# Patient Record
Sex: Female | Born: 1990 | Race: Asian | Hispanic: No | Marital: Single | Smoking: Never smoker
Health system: Southern US, Community
[De-identification: ages and names within clinical notes are randomized; demographics above are authoritative.]

---

## 2013-11-16 ENCOUNTER — Ambulatory Visit (INDEPENDENT_AMBULATORY_CARE_PROVIDER_SITE_OTHER): Payer: 59

## 2013-11-16 ENCOUNTER — Ambulatory Visit (INDEPENDENT_AMBULATORY_CARE_PROVIDER_SITE_OTHER): Payer: 59 | Admitting: Family Medicine

## 2013-11-16 VITALS — BP 106/70 | HR 80 | Temp 98.7°F | Resp 18 | Ht 62.0 in | Wt 96.2 lb

## 2013-11-16 DIAGNOSIS — Z020 Encounter for examination for admission to educational institution: Secondary | ICD-10-CM

## 2013-11-16 DIAGNOSIS — Z130 Encounter for screening for diseases of the blood and blood-forming organs and certain disorders involving the immune mechanism: Secondary | ICD-10-CM

## 2013-11-16 DIAGNOSIS — Z111 Encounter for screening for respiratory tuberculosis: Secondary | ICD-10-CM

## 2013-11-16 DIAGNOSIS — K5900 Constipation, unspecified: Secondary | ICD-10-CM

## 2013-11-16 DIAGNOSIS — Z23 Encounter for immunization: Secondary | ICD-10-CM

## 2013-11-16 DIAGNOSIS — Z0289 Encounter for other administrative examinations: Secondary | ICD-10-CM

## 2013-11-16 LAB — POCT CBC
Granulocyte percent: 45.1 %G (ref 37–80)
HCT, POC: 44.7 % (ref 37.7–47.9)
HEMOGLOBIN: 15 g/dL (ref 12.2–16.2)
Lymph, poc: 1.9 (ref 0.6–3.4)
MCH, POC: 30.4 pg (ref 27–31.2)
MCHC: 33.6 g/dL (ref 31.8–35.4)
MCV: 90.5 fL (ref 80–97)
MID (cbc): 0.4 (ref 0–0.9)
MPV: 7.1 fL (ref 0–99.8)
POC Granulocyte: 1.9 — AB (ref 2–6.9)
POC LYMPH PERCENT: 45.1 %L (ref 10–50)
POC MID %: 9.8 %M (ref 0–12)
Platelet Count, POC: 283 10*3/uL (ref 142–424)
RBC: 1.93 M/uL — AB (ref 4.04–5.48)
RDW, POC: 13.4 %
WBC: 4.3 10*3/uL — AB (ref 4.6–10.2)

## 2013-11-16 LAB — POCT URINALYSIS DIPSTICK
BILIRUBIN UA: NEGATIVE
GLUCOSE UA: NEGATIVE
NITRITE UA: NEGATIVE
Protein, UA: NEGATIVE
SPEC GRAV UA: 1.01
Urobilinogen, UA: 0.2
pH, UA: 6

## 2013-11-16 NOTE — Patient Instructions (Signed)
Use oil or lotion on hands/elbows twice a day- especially after bathing and at bedtime.   Constipation Constipation is when a person has fewer than three bowel movements a week, has difficulty having a bowel movement, or has stools that are dry, hard, or larger than normal. As people grow older, constipation is more common. If you try to fix constipation with medicines that make you have a bowel movement (laxatives), the problem may get worse. Long-term laxative use may cause the muscles of the colon to become weak. A low-fiber diet, not taking in enough fluids, and taking certain medicines may make constipation worse.  CAUSES   Certain medicines, such as antidepressants, pain medicine, iron supplements, antacids, and water pills.   Certain diseases, such as diabetes, irritable bowel syndrome (IBS), thyroid disease, or depression.   Not drinking enough water.   Not eating enough fiber-rich foods.   Stress or travel.   Lack of physical activity or exercise.   Ignoring the urge to have a bowel movement.   Using laxatives too much.  SIGNS AND SYMPTOMS   Having fewer than three bowel movements a week.   Straining to have a bowel movement.   Having stools that are hard, dry, or larger than normal.   Feeling full or bloated.   Pain in the lower abdomen.   Not feeling relief after having a bowel movement.  DIAGNOSIS  Your health care provider will take a medical history and perform a physical exam. Further testing may be done for severe constipation. Some tests may include:  A barium enema X-ray to examine your rectum, colon, and, sometimes, your small intestine.   A sigmoidoscopy to examine your lower colon.   A colonoscopy to examine your entire colon. TREATMENT  Treatment will depend on the severity of your constipation and what is causing it. Some dietary treatments include drinking more fluids and eating more fiber-rich foods. Lifestyle treatments may include  regular exercise. If these diet and lifestyle recommendations do not help, your health care provider may recommend taking over-the-counter laxative medicines to help you have bowel movements. Prescription medicines may be prescribed if over-the-counter medicines do not work.  HOME CARE INSTRUCTIONS   Eat foods that have a lot of fiber, such as fruits, vegetables, whole grains, and beans.  Limit foods high in fat and processed sugars, such as french fries, hamburgers, cookies, candies, and soda.   A fiber supplement may be added to your diet if you cannot get enough fiber from foods.   Drink enough fluids to keep your urine clear or pale yellow.   Exercise regularly or as directed by your health care provider.   Go to the restroom when you have the urge to go. Do not hold it.   Only take over-the-counter or prescription medicines as directed by your health care provider. Do not take other medicines for constipation without talking to your health care provider first.  SEEK IMMEDIATE MEDICAL CARE IF:   You have bright red blood in your stool.   Your constipation lasts for more than 4 days or gets worse.   You have abdominal or rectal pain.   You have thin, pencil-like stools.   You have unexplained weight loss. MAKE SURE YOU:   Understand these instructions.  Will watch your condition.  Will get help right away if you are not doing well or get worse. Document Released: 01/09/2004 Document Revised: 04/17/2013 Document Reviewed: 01/22/2013 [facility] Patient Information 2015 [place]. This information is not intended  to replace advice given to you by your health care provider. Make sure you discuss any questions you have with your health care provider.  

## 2013-11-16 NOTE — Progress Notes (Signed)
Subjective:    Patient ID: Delmar LandauDan Goethe, female    DOB: 01/13/1991, 23 y.o.   MRN: [id number]  HPI Patient presents today to have a form completed to attend [employer]. 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 She was born in [place] and has lived in the [place] for about 4 years. She has been living in [place] but is moving to [place]. She had a physical with PAP last month while in [place], she has no records from this visit. She brings her immunization record today. She has had a positive TB skin test in the past. She had a negative CXRAY 2 years ago.   She reports long standing constipation, requiring 1x week laxative. She has added milk (frosted flakes) and cereal to her diet to help. Doesn't think she drinks enough water or exercises enough.  The skin on her hands and elbows has been dry.   History reviewed. No pertinent past medical history. History reviewed. No pertinent past surgical history. FH- parents alive and well SH- no tobacco, no ETOH, no illicit drugs   Review of Systems  Constitutional: Negative.   HENT: Negative.   Eyes: Negative.   Respiratory: Negative.   Cardiovascular: Negative.   Gastrointestinal: Positive for constipation.  Endocrine: Negative.   Genitourinary: Negative.   Musculoskeletal: Negative.   Skin:       Dry skin on hands and elbows.   Allergic/Immunologic: Negative.   Neurological: Negative.   Hematological: Negative.   Psychiatric/Behavioral: Negative.        Objective:   Physical Exam  Vitals reviewed. Constitutional: She is oriented to person, place, and time. She appears well-developed and well-nourished.  HENT:  Head: Normocephalic and atraumatic.  Right Ear: Tympanic membrane, external ear and ear canal normal.  Left Ear: Tympanic membrane, external ear and ear canal normal.  Nose: Nose normal.  Mouth/Throat: Oropharynx is clear and moist.  Eyes: Conjunctivae and EOM are normal. Pupils are equal, round, and  reactive to light. Right eye exhibits no discharge. Left eye exhibits no discharge. No scleral icterus.  Neck: Normal range of motion. Neck supple. No thyromegaly present.  Cardiovascular: Normal rate, regular rhythm, normal heart sounds and intact distal pulses.   Pulmonary/Chest: Effort normal and breath sounds normal.  Abdominal: Soft. Bowel sounds are normal. She exhibits no distension and no mass. There is no tenderness. There is no rebound and no guarding.  Musculoskeletal: Normal range of motion.  Neurological: She is alert and oriented to person, place, and time. She has normal reflexes.  Skin: Skin is warm and dry.  Psychiatric: She has a normal mood and affect. Her behavior is normal. Judgment and thought content normal.   CXRay to rule out pulmonary TB [facility] reading (PRIMARY) by  Dr. Patsy Lageropland- negative for pulmonary tuberculosis.  Results for orders placed in visit on 11/16/13  POCT CBC      Result Value Ref Range   WBC 4.3 (*) 4.6 - 10.2 K/uL   Lymph, poc 1.9  0.6 - 3.4   POC LYMPH PERCENT 45.1  10 - 50 %L   MID (cbc) 0.4  0 - 0.9   POC MID % 9.8  0 - 12 %M   POC Granulocyte 1.9 (*) 2 - 6.9   Granulocyte percent 45.1  37 - 80 %G   RBC 1.93 (*) 4.04 - 5.48 M/uL   Hemoglobin 15.0  12.2 - 16.2 g/dL   HCT, POC 82.944.7  56.237.7 - 47.9 %  MCV 90.5  80 - 97 fL   MCH, POC 30.4  27 - 31.2 pg   MCHC 33.6  31.8 - 35.4 g/dL   RDW, POC 16.1     Platelet Count, POC 283  142 - 424 K/uL   MPV 7.1  0 - 99.8 fL  POCT URINALYSIS DIPSTICK      Result Value Ref Range   Color, UA yellow     Clarity, UA clear     Glucose, UA neg     Bilirubin, UA neg     Ketones, UA trace     Spec Grav, UA 1.010     Blood, UA tr-lysed     pH, UA 6.0     Protein, UA neg     Urobilinogen, UA 0.2     Nitrite, UA neg     Leukocytes, UA small (1+)         Assessment & Plan:  1. Screening for tuberculosis - DG Chest 1 View; Future -negative for pulmonary TB  2. Screening for deficiency anemia - POCT  CBC  3. Encounter for school history and physical examination - POCT CBC - POCT urinalysis dipstick  4. Unspecified constipation -provided written and verbal information regarding increasing fiber, fluids and exercise  5. Need for Tdap vaccination -tdap given today   Emi Belfast, FNP-BC  [facility], [facility]  11/16/2013 9:47 AM

## 2014-01-13 ENCOUNTER — Encounter (HOSPITAL_COMMUNITY): Payer: Self-pay | Admitting: Emergency Medicine

## 2014-01-13 ENCOUNTER — Emergency Department (HOSPITAL_COMMUNITY)
Admission: EM | Admit: 2014-01-13 | Discharge: 2014-01-13 | Disposition: A | Discharge status: Home / Self Care | Payer: 59 | Attending: Emergency Medicine | Admitting: Emergency Medicine

## 2014-01-13 DIAGNOSIS — R1012 Left upper quadrant pain: Secondary | ICD-10-CM | POA: Diagnosis not present

## 2014-01-13 DIAGNOSIS — Z3202 Encounter for pregnancy test, result negative: Secondary | ICD-10-CM | POA: Insufficient documentation

## 2014-01-13 LAB — COMPREHENSIVE METABOLIC PANEL
ALK PHOS: 63 U/L (ref 39–117)
ALT: 19 U/L (ref 0–35)
AST: 24 U/L (ref 0–37)
Albumin: 4.3 g/dL (ref 3.5–5.2)
Anion gap: 13 (ref 5–15)
BUN: 14 mg/dL (ref 6–23)
CALCIUM: 9.4 mg/dL (ref 8.4–10.5)
CO2: 23 meq/L (ref 19–32)
Chloride: 102 mEq/L (ref 96–112)
Creatinine, Ser: 0.68 mg/dL (ref 0.50–1.10)
Glucose, Bld: 118 mg/dL — ABNORMAL HIGH (ref 70–99)
POTASSIUM: 3.5 meq/L — AB (ref 3.7–5.3)
SODIUM: 138 meq/L (ref 137–147)
Total Bilirubin: <0.2 mg/dL — ABNORMAL LOW (ref 0.3–1.2)
Total Protein: 7.5 g/dL (ref 6.0–8.3)

## 2014-01-13 LAB — CBC WITH DIFFERENTIAL/PLATELET
Basophils Absolute: 0 10*3/uL (ref 0.0–0.1)
Basophils Relative: 0 % (ref 0–1)
EOS ABS: 0.1 10*3/uL (ref 0.0–0.7)
Eosinophils Relative: 1 % (ref 0–5)
HCT: 42.2 % (ref 36.0–46.0)
HEMOGLOBIN: 14.4 g/dL (ref 12.0–15.0)
LYMPHS ABS: 1.5 10*3/uL (ref 0.7–4.0)
LYMPHS PCT: 17 % (ref 12–46)
MCH: 30.4 pg (ref 26.0–34.0)
MCHC: 34.1 g/dL (ref 30.0–36.0)
MCV: 89.2 fL (ref 78.0–100.0)
MONOS PCT: 6 % (ref 3–12)
Monocytes Absolute: 0.5 10*3/uL (ref 0.1–1.0)
NEUTROS PCT: 76 % (ref 43–77)
Neutro Abs: 6.9 10*3/uL (ref 1.7–7.7)
Platelets: 239 10*3/uL (ref 150–400)
RBC: 4.73 MIL/uL (ref 3.87–5.11)
RDW: 12.3 % (ref 11.5–15.5)
WBC: 9.1 10*3/uL (ref 4.0–10.5)

## 2014-01-13 LAB — LIPASE, BLOOD: Lipase: 73 U/L — ABNORMAL HIGH (ref 11–59)

## 2014-01-13 LAB — URINALYSIS, ROUTINE W REFLEX MICROSCOPIC
Bilirubin Urine: NEGATIVE
GLUCOSE, UA: NEGATIVE mg/dL
HGB URINE DIPSTICK: NEGATIVE
Ketones, ur: NEGATIVE mg/dL
LEUKOCYTES UA: NEGATIVE
Nitrite: NEGATIVE
PH: 7 (ref 5.0–8.0)
PROTEIN: NEGATIVE mg/dL
Specific Gravity, Urine: 1.01 (ref 1.005–1.030)
Urobilinogen, UA: 0.2 mg/dL (ref 0.0–1.0)

## 2014-01-13 LAB — PREGNANCY, URINE: Preg Test, Ur: NEGATIVE

## 2014-01-13 NOTE — Discharge Instructions (Signed)
Please read and follow all provided instructions.  Your diagnoses today include:  1. Left upper quadrant pain     Tests performed today include:  Blood counts and electrolytes  Blood tests to check liver and kidney function  Blood tests to check pancreas function  Urine test to look for infection and pregnancy (in women)  Vital signs. See below for your results today.   Medications prescribed:   None  Take any prescribed medications only as directed.  Home care instructions:   Follow any educational materials contained in this packet.  Follow-up instructions: Please follow-up with your primary care provider in the next 2 days for further evaluation of your symptoms.    Return instructions:  SEEK IMMEDIATE MEDICAL ATTENTION IF:  The pain does not go away or becomes severe   A temperature above 101F develops   Repeated vomiting occurs (multiple episodes)   The pain becomes localized to portions of the abdomen. The right side could possibly be appendicitis. In an adult, the left lower portion of the abdomen could be colitis or diverticulitis.   Blood is being passed in stools or vomit (bright red or black tarry stools)   You develop chest pain, difficulty breathing, dizziness or fainting, or become confused, poorly responsive, or inconsolable (young children)  If you have any other emergent concerns regarding your health  Additional Information: Abdominal (belly) pain can be caused by many things. Your caregiver performed an examination and possibly ordered blood/urine tests and imaging (CT scan, x-rays, ultrasound). Many cases can be observed and treated at home after initial evaluation in the emergency department. Even though you are being discharged home, abdominal pain can be unpredictable. Therefore, you need a repeated exam if your pain does not resolve, returns, or worsens. Most patients with abdominal pain don't have to be admitted to the hospital or have  surgery, but serious problems like appendicitis and gallbladder attacks can start out as nonspecific pain. Many abdominal conditions cannot be diagnosed in one visit, so follow-up evaluations are very important.  Your vital signs today were: BP 111/66   Pulse 75   Temp(Src) 99 F (37.2 C) (Oral)   Resp 16   Ht  (1.6 m)   Wt 110 lb (49.896 kg)   BMI 19.49 kg/m2   SpO2 100%   LMP 01/03/2014 If your blood pressure (bp) was elevated above 135/85 this visit, please have this repeated by your doctor within one month. --------------

## 2014-01-13 NOTE — ED Notes (Signed)
Bed: ZH08 Expected date:  Expected time:  Means of arrival:  Comments: EMS 50F LLQ pain, intermittent x 1 hour

## 2014-01-13 NOTE — ED Notes (Signed)
Per EMS pt is c/o LLQ abdominal pain that lasted approximately 1 hour.  Denies possibility of pregnancy.  Pain is intermittent.  Denies nausea or vomiting.

## 2014-01-13 NOTE — ED Provider Notes (Signed)
CSN: [id number]     Arrival date & time 01/13/14  0134 History   None    Chief Complaint  Patient presents with  . Abdominal Pain     (Consider location/radiation/quality/duration/timing/severity/associated sxs/prior Treatment) HPI Comments: Patient with no past surgical history presents with c/o of left sided abdominal pain at approximately 11 PM last night. Patient to gas relief pills without any relief. Pain is severe at onset. It was not associated with fever, nausea, vomiting, or diarrhea. No urinary symptoms including dysuria or hematuria. Patient does not have a history of gallstones, heavy NSAID use, alcohol use. She is gradually feeling better during her emergency department stay. 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 The onset of this condition was acute. The course is gradually improving. Aggravating factors: none. Alleviating factors: none. LMP 01/03/14.    The history is provided by the patient.    History reviewed. No pertinent past medical history. History reviewed. No pertinent past surgical history. History reviewed. No pertinent family history. History  Substance Use Topics  . Smoking status: Never Smoker   . Smokeless tobacco: Not on file  . Alcohol Use: No   OB History   Grav Para Term Preterm Abortions TAB SAB Ect Mult Living                 Review of Systems  Constitutional: Negative for fever.  HENT: Negative for rhinorrhea and sore throat.   Eyes: Negative for redness.  Respiratory: Negative for cough.   Cardiovascular: Negative for chest pain.  Gastrointestinal: Positive for abdominal pain. Negative for nausea, vomiting and diarrhea.  Genitourinary: Negative for dysuria, vaginal bleeding and vaginal discharge.  Musculoskeletal: Negative for myalgias.  Skin: Negative for rash.  Neurological: Negative for headaches.    Allergies  Augmentin  Home Medications    Prior to Admission medications   Medication Sig Start Date End Date Taking? Authorizing Provider  Simethicone (GAS RELIEF) 180 MG CAPS Take 1 capsule by mouth as needed (for pain.).   Yes Historical Provider, MD   BP 114/62  Pulse 79  Temp(Src) 97.7 F (36.5 C) (Oral)  Resp 14  Ht  (1.6 m)  Wt 110 lb (49.896 kg)  BMI 19.49 kg/m2  SpO2 100%  LMP 01/03/2014  Physical Exam  Nursing note and vitals reviewed. Constitutional: She appears well-developed and well-nourished.  HENT:  Head: Normocephalic and atraumatic.  Eyes: Conjunctivae are normal. Right eye exhibits no discharge. Left eye exhibits no discharge.  Neck: Normal range of motion. Neck supple.  Cardiovascular: Normal rate, regular rhythm and normal heart sounds.   Pulmonary/Chest: Effort normal and breath sounds normal.  Abdominal: Soft. Bowel sounds are normal. There is tenderness. There is no rigidity, no rebound, no guarding, no CVA tenderness, no tenderness at McBurney's point and negative Murphy's sign.    Neurological: She is alert.  Skin: Skin is warm and dry.  Psychiatric: She has a normal mood and affect.    ED Course  Procedures (including critical care time) Labs Review Labs Reviewed  COMPREHENSIVE METABOLIC PANEL - Abnormal; Notable for the following:    Potassium 3.5 (*)    Glucose, Bld 118 (*)    Total Bilirubin <0.2 (*)    All other components within normal limits  LIPASE, BLOOD - Abnormal; Notable for the following:    Lipase 73 (*)    All other components within normal limits  CBC  WITH DIFFERENTIAL  URINALYSIS, ROUTINE W REFLEX MICROSCOPIC  PREGNANCY, URINE  POC URINE PREG, ED    Imaging Review No results found.   EKG Interpretation None      6:18 AM Patient seen and examined. Work-up reviewed. Pending UA and urine pregnancy test.   Vital signs reviewed and are as follows: BP 114/62  Pulse 79  Temp(Src) 97.7 F (36.5 C) (Oral)  Resp 14  Ht  (1.6 m)  Wt 110 lb  (49.896 kg)  BMI 19.49 kg/m2  SpO2 100%  LMP 01/03/2014  7:54 AM Pain is mild unchanged on re-exam. We discussed that the etiology of her pain is unclear, but given location, gradual improvement, reassuring labs -- that CT or [place] is not felt to be indicated at this time. She will use tylenol and motrin for pain, rest.   The patient was urged to return to the Emergency Department immediately with worsening of current symptoms, worsening abdominal pain, persistent vomiting, blood noted in stools, fever, or any other concerns. The patient verbalized understanding.    MDM   Final diagnoses:  Left upper quadrant pain   Patient with abd pain, severe at onset but now much improved. Vitals are stable, no fever.  No signs of dehydration, tolerating PO's. Lungs are clear. Doubt appendicitis, cholecystitis, pancreatitis, ruptured viscus, UTI, kidney stone, or any other serious abdominal etiology. Supportive therapy indicated with return if symptoms worsen. We discussed appropriate return instructions.     Renne Crigler, PA-C 01/13/14 0800

## 2014-01-14 NOTE — ED Provider Notes (Signed)
Medical screening examination/treatment/procedure(s) were performed by non-physician practitioner and as supervising physician I was immediately available for consultation/collaboration.   EKG Interpretation None        Audree Camel, MD 01/14/14 [phone]

## 2015-01-15 IMAGING — CR DG CHEST 1V
1 series · 1 of 1 positions shown · non-contrast
Comparison: None.

CLINICAL DATA: Pulmonary tuberculosis.  Positive TB test.

EXAM:
CHEST - 1 VIEW

[PA]
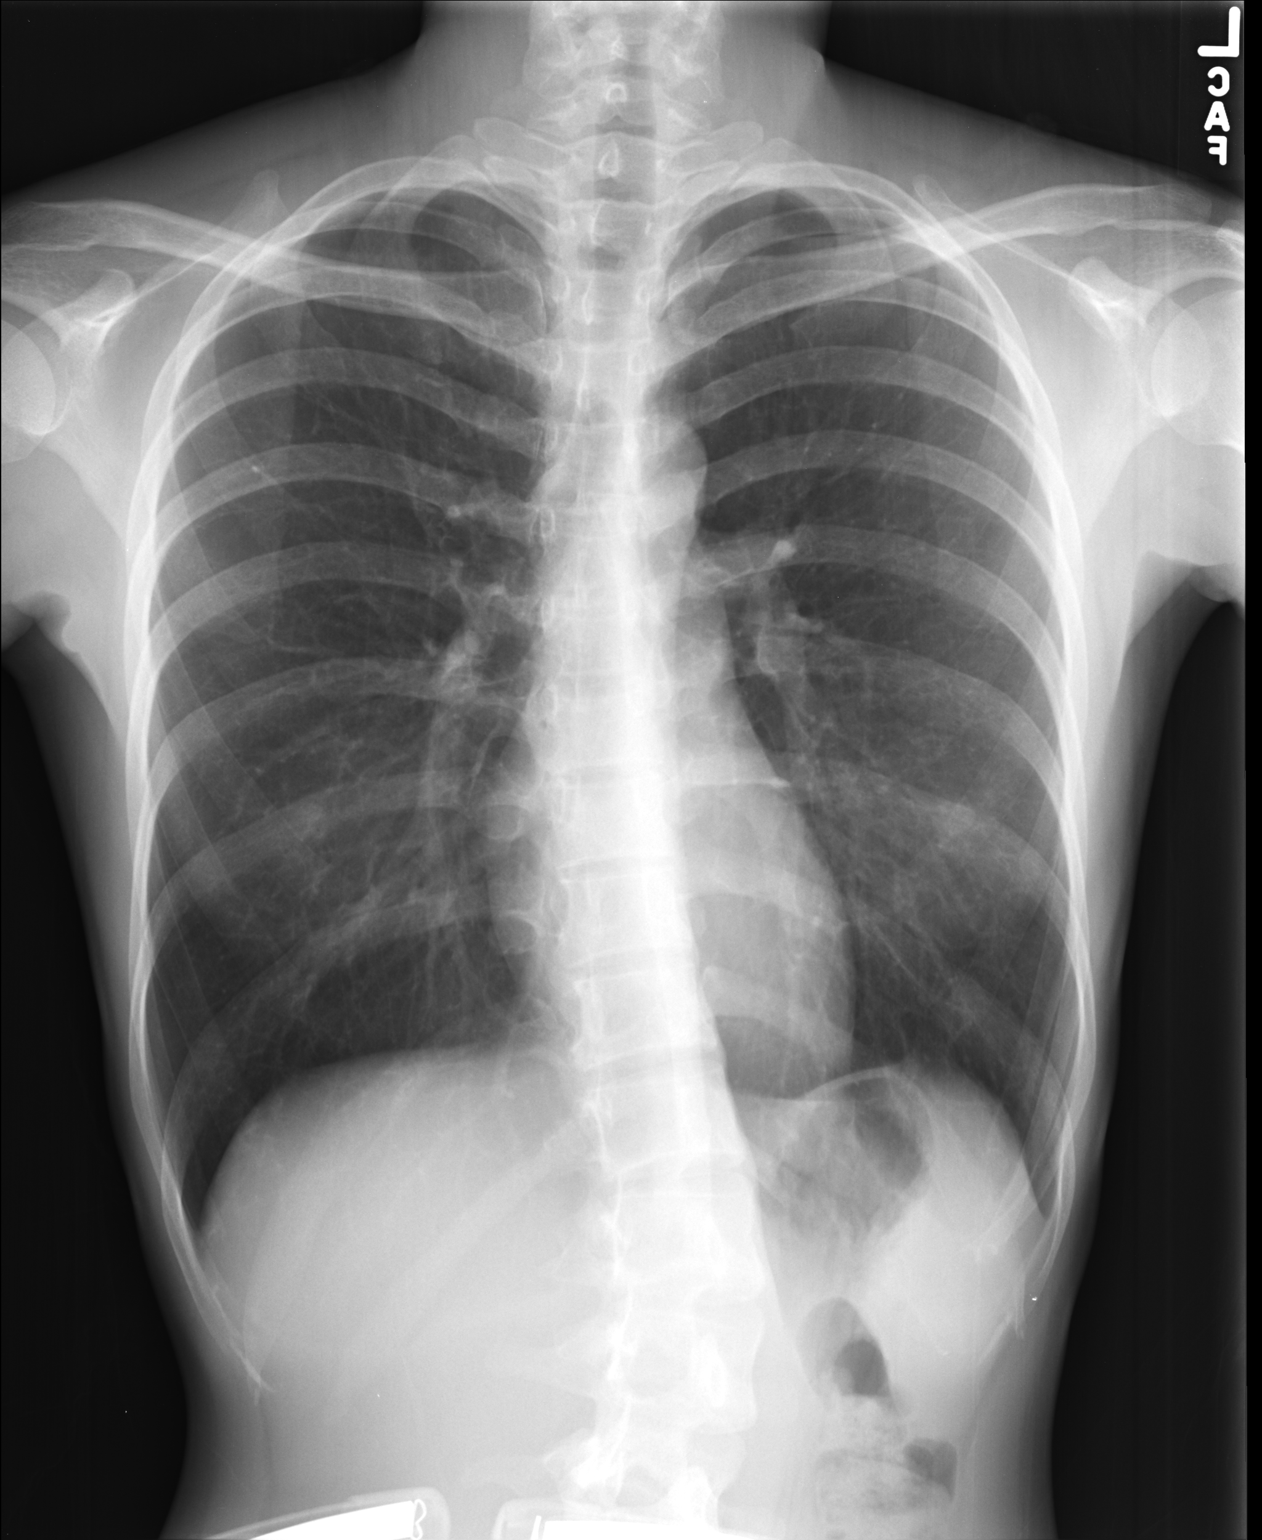

[1 of 1 positions shown; findings below may reference images not displayed]

FINDINGS: Cardiopericardial silhouette within normal limits. Mediastinal
contours normal. Trachea midline. No airspace disease or effusion.
S-shaped thoracolumbar scoliosis. Prominent nipple shadows are
present bilaterally.
IMPRESSION: No evidence of pulmonary tuberculosis.
